# Patient Record
Sex: Male | Born: 1970 | Race: Black or African American | Hispanic: No | Marital: Married | Smoking: Never smoker
Health system: Southern US, Community
[De-identification: ages and names within clinical notes are randomized; demographics above are authoritative.]

## PROBLEM LIST (undated history)

## (undated) DIAGNOSIS — I10 Essential (primary) hypertension: Secondary | ICD-10-CM

---

## 2016-06-06 ENCOUNTER — Emergency Department: Payer: 59

## 2016-06-06 ENCOUNTER — Encounter: Payer: Self-pay | Admitting: Medical Oncology

## 2016-06-06 ENCOUNTER — Emergency Department
Admission: EM | Admit: 2016-06-06 | Discharge: 2016-06-06 | Disposition: A | Discharge status: Home / Self Care | Payer: 59 | Attending: Emergency Medicine | Admitting: Emergency Medicine

## 2016-06-06 DIAGNOSIS — M25512 Pain in left shoulder: Secondary | ICD-10-CM | POA: Insufficient documentation

## 2016-06-06 DIAGNOSIS — M25511 Pain in right shoulder: Secondary | ICD-10-CM

## 2016-06-06 DIAGNOSIS — I10 Essential (primary) hypertension: Secondary | ICD-10-CM | POA: Diagnosis not present

## 2016-06-06 HISTORY — DX: Essential (primary) hypertension: I10

## 2016-06-06 MED ORDER — METHOCARBAMOL 750 MG PO TABS: 750.0000 mg | ORAL_TABLET | Freq: Four times a day (QID) | ORAL | 0 refills | Status: DC

## 2016-06-06 MED ORDER — IBUPROFEN 800 MG PO TABS: 800.0000 mg | ORAL_TABLET | Freq: Three times a day (TID) | ORAL | 0 refills | Status: AC | PRN

## 2016-06-06 NOTE — ED Notes (Signed)
Pt reports rt shoulder pain since wed but worsened this am. 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 Pt denies chest pain. Pain only present with movement of shoulder.

## 2016-06-06 NOTE — ED Triage Notes (Signed)
Acute onset of shoulder pain after work. LROM. No raidation of pain. NAD noted

## 2016-06-06 NOTE — ED Provider Notes (Signed)
[facility] Emergency Department Provider Note  ____________________________________________  Time seen: Approximately 11:27 AM  I have reviewed the triage vital signs and the nursing notes.   HISTORY  Chief Complaint Shoulder Pain    HPI Cole Solis is a 45 y.o. male presents with complaints of rash or pain times a week. 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 States this morning is unable to lift or move his shoulder. Describes pain is 10 over 10 at the time. Denies any direct trauma to the shoulder patient does have a past medical history for hypertension and is on lisinopril.   Past Medical History:  Diagnosis Date  . Hypertension     There are no active problems to display for this patient.   History reviewed. No pertinent surgical history.  Prior to Admission medications   Medication Sig Start Date End Date Taking? Authorizing Provider  ibuprofen (ADVIL,MOTRIN) 800 MG tablet Take 1 tablet (800 mg total) by mouth every 8 (eight) hours as needed. 06/06/16   Charmayne Sheer Devontaye Ground, PA-C  methocarbamol (ROBAXIN) 750 MG tablet Take 1 tablet (750 mg total) by mouth 4 (four) times daily. 06/06/16   Evangeline Dakin, PA-C    Allergies Review of patient's allergies indicates no known allergies.  No family history on file.  Social History Social History  Substance Use Topics  . Smoking status: Never Smoker  . Smokeless tobacco: Not on file  . Alcohol use Not on file    Review of Systems Constitutional: No fever/chills Eyes: No visual changes. ENT: No sore throat. Cardiovascular: Denies chest pain. Respiratory: Denies shortness of breath. Musculoskeletal: Positive for right shoulder pain. Skin: Negative for rash. Neurological: Negative for headaches, focal weakness or numbness.  10-point ROS otherwise negative.  ____________________________________________   PHYSICAL EXAM: BP  (!) 151/105 (BP Location: Left Arm)   Pulse 85   Temp 98.2 F (36.8 C) (Oral)   Resp 18   Ht 5\' 9"  (1.753 m)   Wt 90.7 kg   SpO2 97%   BMI 29.53 kg/m   VITAL SIGNS: ED Triage Vitals  Enc Vitals Group     BP      Pulse      Resp      Temp      Temp src      SpO2      Weight      Height      Head Circumference      Peak Flow      Pain Score      Pain Loc      Pain Edu?      Excl. in GC?     Constitutional: Alert and oriented. Well appearing and in no acute distress. Cardiovascular: Normal rate, regular rhythm. Grossly normal heart sounds.  Good peripheral circulation. Respiratory: Normal respiratory effort.  No retractions. Lungs CTAB. Musculoskeletal: Right shoulder with limited range of motion increased pain with extension and abduction and adduction totally neurovascularly intact. Neurologic:  Normal speech and language. No gross focal neurologic deficits are appreciated. No gait instability. Skin:  Skin is warm, dry and intact. No rash noted. Psychiatric: Mood and affect are normal. Speech and behavior are normal.  ____________________________________________   LABS (all labs ordered are listed, but only abnormal results are displayed)  Labs Reviewed - No data to display ____________________________________________  EKG   ____________________________________________  RADIOLOGY  No acute osseous findings noted. ____________________________________________  PROCEDURES  Procedure(s) performed: None  Critical Care performed: No  ____________________________________________   INITIAL IMPRESSION / ASSESSMENT AND PLAN / ED COURSE  Pertinent labs & imaging results that were available during my care of the patient were reviewed by me and considered in my medical decision making (see chart for details).  Hypertension poorly controlled. Would make a recommendation that he should probably switch groups of blood pressure medications and consider being on  calcium channel blocker. Right shoulder strain with no fracture. Rx given for baclofen 10 mg 4 times a day ibuprofen 800 mg 3 times a day. He is to follow-up with his PCP or return to the ER with any worsening symptomology.  Clinical Course    ____________________________________________   FINAL CLINICAL IMPRESSION(S) / ED DIAGNOSES  Final diagnoses:  Shoulder pain, right     This chart was dictated using voice recognition software/Dragon. Despite best efforts to proofread, errors can occur which can change the meaning. Any change was purely unintentional.    Evangeline Dakin, PA-C 06/06/16 1224    Jene Every, MD 06/06/16 [phone]

## 2016-06-06 NOTE — ED Triage Notes (Signed)
PT reports he began having rt shoulder pain with movement wed. 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

## 2016-12-25 ENCOUNTER — Encounter: Payer: Self-pay | Admitting: Emergency Medicine

## 2016-12-25 ENCOUNTER — Emergency Department
Admission: EM | Admit: 2016-12-25 | Discharge: 2016-12-25 | Disposition: A | Discharge status: Home / Self Care | Payer: 59 | Attending: Student in an Organized Health Care Education/Training Program | Admitting: Student in an Organized Health Care Education/Training Program

## 2016-12-25 DIAGNOSIS — S3992XA Unspecified injury of lower back, initial encounter: Secondary | ICD-10-CM | POA: Diagnosis present

## 2016-12-25 DIAGNOSIS — I10 Essential (primary) hypertension: Secondary | ICD-10-CM | POA: Diagnosis not present

## 2016-12-25 DIAGNOSIS — S39012A Strain of muscle, fascia and tendon of lower back, initial encounter: Secondary | ICD-10-CM | POA: Diagnosis not present

## 2016-12-25 DIAGNOSIS — Y9389 Activity, other specified: Secondary | ICD-10-CM | POA: Insufficient documentation

## 2016-12-25 DIAGNOSIS — M62830 Muscle spasm of back: Secondary | ICD-10-CM

## 2016-12-25 DIAGNOSIS — Y990 Civilian activity done for income or pay: Secondary | ICD-10-CM | POA: Insufficient documentation

## 2016-12-25 DIAGNOSIS — X501XXA Overexertion from prolonged static or awkward postures, initial encounter: Secondary | ICD-10-CM | POA: Diagnosis not present

## 2016-12-25 DIAGNOSIS — Y929 Unspecified place or not applicable: Secondary | ICD-10-CM | POA: Diagnosis not present

## 2016-12-25 MED ORDER — PREDNISONE 10 MG PO TABS: 10.0000 mg | ORAL_TABLET | Freq: Every day | ORAL | 0 refills | Status: AC

## 2016-12-25 MED ORDER — LISINOPRIL 5 MG PO TABS: 5.0000 mg | ORAL_TABLET | Freq: Every day | ORAL | 1 refills | Status: AC

## 2016-12-25 MED ORDER — BACLOFEN 10 MG PO TABS: 10.0000 mg | ORAL_TABLET | Freq: Three times a day (TID) | ORAL | 0 refills | Status: AC

## 2016-12-25 NOTE — Discharge Instructions (Signed)
Please take blood pressure medication as prescribed and follow-up with your primary care physician as soon as possible. Return to the ER for any headaches chest pain shortness of breath worsening symptoms or urgent changes in her health. Please work on doing gentle stretching exercises of the lower back. Take medications as prescribed for muscle spasms. Avoid any heavy lifting pushing or pulling. Follow-up with orthopedist in 7 days if no improvement.

## 2016-12-25 NOTE — ED Notes (Signed)

## 2016-12-25 NOTE — ED Triage Notes (Signed)
Pain in back after lifting at work Tuesday 4 days ago.

## 2016-12-25 NOTE — ED Provider Notes (Signed)
[facility]-EMERGENCY DEPARTMENT Provider Note   CSN: [id number] Arrival date & time: 12/25/16  1446     History   Chief Complaint Chief Complaint  Patient presents with  . Back Pain    HPI Cole Solis is a 46 y.o. male  presents to the emergency department for evaluation of lower back pain. Patient developed lower back pain 4 days ago at work. 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 Patient went home that night developed significant tightness in the left and right side of his lumbar spine. He was able to return to work the next day because the day went on his lower back continued to tighten up. Patient tried taking a muscle relaxer he had at home, Robaxin 750 mg 4 times a day with no improvement. He took occasional leaf with minimal improvement. Patient has been resting today and has noticed continued tightness of the lower back of the lumbar spine. There is no numbness tingling or radicular symptoms in the lower extremities. He denies any chest pain, shortness of breath, abdominal pain, weakness of the lower extremities. No loss of bowel or bladder symptoms. No urinary symptoms. Patient is noted to have high blood pressure in triage, states he has a history of hypertension that was managed in the past with lisinopril 5 mg daily. He has been without this medication for quite some time and has not followed up with primary care provider to get the refill. He currently denies any headaches, vision changes, chest pain, chest pain with exertion.  HPI  Past Medical History:  Diagnosis Date  . Hypertension     There are no active problems to display for this patient.   History reviewed. No pertinent surgical history.     Home Medications    Prior to Admission medications   Medication Sig Start Date End Date Taking? Authorizing Provider  baclofen (LIORESAL) 10 MG tablet Take 1 tablet  (10 mg total) by mouth 3 (three) times daily. As needed for muscle spasm 12/25/16   Evon Slack, PA-C  ibuprofen (ADVIL,MOTRIN) 800 MG tablet Take 1 tablet (800 mg total) by mouth every 8 (eight) hours as needed. 06/06/16   Charmayne Sheer Beers, PA-C  lisinopril (PRINIVIL,ZESTRIL) 5 MG tablet Take 1 tablet (5 mg total) by mouth daily. 12/25/16 12/25/17  Evon Slack, PA-C  predniSONE (DELTASONE) 10 MG tablet Take 1 tablet (10 mg total) by mouth daily. 6,5,4,3,2,1 six day taper 12/25/16   Evon Slack, PA-C    Family History No family history on file.  Social History Social History  Substance Use Topics  . Smoking status: Never Smoker  . Smokeless tobacco: Not on file  . Alcohol use Not on file     Allergies   Patient has no known allergies.   Review of Systems Review of Systems  Constitutional: Negative.  Negative for activity change, appetite change, chills and fever.  HENT: Negative for congestion, ear pain, mouth sores, rhinorrhea, sinus pressure, sore throat and trouble swallowing.   Eyes: Negative for photophobia, pain and discharge.  Respiratory: Negative for cough, chest tightness and shortness of breath.   Cardiovascular: Negative for chest pain and leg swelling.  Gastrointestinal: Negative for abdominal distention, abdominal pain, diarrhea, nausea and vomiting.  Genitourinary: Negative for difficulty urinating and dysuria.  Musculoskeletal: Positive for back pain. Negative for arthralgias and gait problem.  Skin:  Negative for color change and rash.  Neurological: Negative for dizziness and headaches.  Hematological: Negative for adenopathy.  Psychiatric/Behavioral: Negative for agitation and behavioral problems.     Physical Exam Updated Vital Signs BP (!) 164/105   Pulse 79   Temp 98.2 F (36.8 C) (Oral)   Resp 20   Ht 5\' 9"  (1.753 m)   Wt 90.7 kg   SpO2 97%   BMI 29.53 kg/m   Physical Exam  Constitutional: He appears well-developed and well-nourished.    HENT:  Head: Normocephalic and atraumatic.  Eyes: Conjunctivae are normal.  Neck: Normal range of motion. Neck supple.  Cardiovascular: Normal rate, regular rhythm, normal heart sounds and intact distal pulses.  Exam reveals no gallop and no friction rub.   No murmur heard. Pulmonary/Chest: Effort normal and breath sounds normal. No respiratory distress. He exhibits no tenderness.  Abdominal: Soft. He exhibits no mass. There is no tenderness. There is no rebound and no guarding.  Musculoskeletal: He exhibits no edema.  Lumbar Spine: Examination of the lumbar spine reveals no bony abnormality, no edema, and no ecchymosis.  There is no step off.  The patient has decreased range of motion of the lumbar spine with flexion and extension. The patient has normal lateral bend and rotation.  The patient has pain mostly with lumbar flexion.  The patient has a negative axial load test, and a negative rotational Waddell test.  The patient is non tender along the spinous process.  The patient is tender along the left and right paravertebral muscles of the lumbosacral junction.  The patient is non tender along the iliac crest.  The patient is non tender in the sciatic notch.  The patient is non tender along the Sacroiliac joint.  There is no Coccyx joint tenderness.    Bilateral Lower Extremities: Examination of the lower extremities reveals no bony abnormality, no edema, and no ecchymosis.  The patient has full active and passive range of motion of the hips, knees, and ankles.  There is no discomfort with range of motion exercises.  The patient is non tender along the greater trochanter region.  The patient has a negative Denna HaggardHomans' test bilaterally.  There is normal skin warmth.  There is normal capillary refill bilaterally.    Neurologic: The patient has a negative straight leg raise.  The patient has normal muscle strength testing for the quadriceps, calves, ankle dorsiflexion, ankle plantarflexion, and  extensor hallicus longus.  The patient has sensation that is intact to light touch.   Neurological: He is alert.  Skin: Skin is warm and dry.  Psychiatric: He has a normal mood and affect. His behavior is normal. Judgment and thought content normal.  Nursing note and vitals reviewed.    ED Treatments / Results  Labs (all labs ordered are listed, but only abnormal results are displayed) Labs Reviewed - No data to display  EKG  EKG Interpretation None       Radiology No results found.  Procedures Procedures (including critical care time)  Medications Ordered in ED Medications - No data to display   Initial Impression / Assessment and Plan / ED Course  I have reviewed the triage vital signs and the nursing notes.  Pertinent labs & imaging results that were available during my care of the patient were reviewed by me and considered in my medical decision making (see chart for details).     46 year old male with mechanical low back pain after lifting an item at work 4  days ago. Physical examination consistent with lower lumbar muscle strain. He has no sciatica or neurological deficits. Recommend prednisone with baclofen. He is noted to have hypertension today in the emergency department, he has been without his blood pressure medicine. We will refill this today. No chest pain or shortness of breath, headache, nausea, diaphoresis. No chest pain with exertion. We'll have patient follow-up with PCP as soon as possible. In regards to lower back pain follow-up with orthopedics if no improvement in 5-7 days. Educated on signs and symptoms for return to the ED for.  Final Clinical Impressions(s) / ED Diagnoses   Final diagnoses:  Muscle spasm of back  Hypertension, unspecified type  Strain of lumbar region, initial encounter    New Prescriptions New Prescriptions   BACLOFEN (LIORESAL) 10 MG TABLET    Take 1 tablet (10 mg total) by mouth 3 (three) times daily. As needed for muscle  spasm   LISINOPRIL (PRINIVIL,ZESTRIL) 5 MG TABLET    Take 1 tablet (5 mg total) by mouth daily.   PREDNISONE (DELTASONE) 10 MG TABLET    Take 1 tablet (10 mg total) by mouth daily. 6,5,4,3,2,1 six day taper     Evon Slack, PA-C 12/25/16 1606    Willy Eddy, MD 12/25/16 [phone]

## 2016-12-25 NOTE — ED Notes (Signed)
See triage note. C/o lower back pain since Monday following lifting injury at work. Pt states he has been using icyhot patches and robaxin 750mg  4x/day with some relief but pain has worsened over the week.

## 2018-04-28 ENCOUNTER — Other Ambulatory Visit: Payer: Self-pay | Admitting: Nephrology

## 2018-04-28 DIAGNOSIS — N183 Chronic kidney disease, stage 3 unspecified: Secondary | ICD-10-CM

## 2018-04-28 DIAGNOSIS — N281 Cyst of kidney, acquired: Secondary | ICD-10-CM

## 2018-04-30 ENCOUNTER — Encounter: Payer: Self-pay | Admitting: Emergency Medicine

## 2018-04-30 ENCOUNTER — Emergency Department: Payer: 59

## 2018-04-30 ENCOUNTER — Emergency Department
Admission: EM | Admit: 2018-04-30 | Discharge: 2018-04-30 | Disposition: A | Discharge status: Home / Self Care | Payer: 59 | Attending: Emergency Medicine | Admitting: Emergency Medicine

## 2018-04-30 DIAGNOSIS — Z79899 Other long term (current) drug therapy: Secondary | ICD-10-CM | POA: Diagnosis not present

## 2018-04-30 DIAGNOSIS — K439 Ventral hernia without obstruction or gangrene: Secondary | ICD-10-CM | POA: Insufficient documentation

## 2018-04-30 DIAGNOSIS — I10 Essential (primary) hypertension: Secondary | ICD-10-CM | POA: Insufficient documentation

## 2018-04-30 DIAGNOSIS — R109 Unspecified abdominal pain: Secondary | ICD-10-CM | POA: Diagnosis present

## 2018-04-30 MED ORDER — HYDROCODONE-ACETAMINOPHEN 5-325 MG PO TABS: 1.0000 | ORAL_TABLET | ORAL | 0 refills | Status: AC | PRN

## 2018-04-30 NOTE — ED Notes (Signed)
AAOx3.  Skin warm and dry.  NAD 

## 2018-04-30 NOTE — ED Notes (Signed)
Pt back from ct

## 2018-04-30 NOTE — ED Provider Notes (Signed)
[facility] Emergency Department Provider Note  Time seen: 2:26 PM  I have reviewed the triage vital signs and the nursing notes.   HISTORY  Chief Complaint Hernia    HPI Luismario Coston is a 47 y.o. male with a past medical history of hypertension presents to the emergency department for abdominal pain.  According to the patient he has had a hernia in the center of his abdomen for several years.  States he has spoken to his surgeon about getting it repaired but he did not have the finances at the time.  He states today he was moving some things around the house when he felt increased pain in this area.  States the hernia is bulging out a little, but states it always does that.  States mild to moderate discomfort over the area of the hernia.  Denies any nausea or vomiting.  Normal bowel movement today.  Denies any fever.   Past Medical History:  Diagnosis Date  . Hypertension     There are no active problems to display for this patient.   History reviewed. No pertinent surgical history.  Prior to Admission medications   Medication Sig Start Date End Date Taking? Authorizing Provider  baclofen (LIORESAL) 10 MG tablet Take 1 tablet (10 mg total) by mouth 3 (three) times daily. As needed for muscle spasm 12/25/16   Evon Slack, PA-C  ibuprofen (ADVIL,MOTRIN) 800 MG tablet Take 1 tablet (800 mg total) by mouth every 8 (eight) hours as needed. 06/06/16   Beers, Charmayne Sheer, PA-C  lisinopril (PRINIVIL,ZESTRIL) 5 MG tablet Take 1 tablet (5 mg total) by mouth daily. 12/25/16 12/25/17  Evon Slack, PA-C  predniSONE (DELTASONE) 10 MG tablet Take 1 tablet (10 mg total) by mouth daily. 6,5,4,3,2,1 six day taper 12/25/16   Evon Slack, PA-C    No Known Allergies  No family history on file.  Social History Social History   Tobacco Use  . Smoking status: Never Smoker  . Smokeless tobacco: Never Used  Substance Use Topics  . Alcohol use: Not on file  .  Drug use: Not on file    Review of Systems Constitutional: Negative for fever. Cardiovascular: Negative for chest pain. Respiratory: Negative for shortness of breath. Gastrointestinal: Mild to moderate mid abdominal pain overlying the hernia site.  Negative for nausea vomiting.  Negative for constipation. Genitourinary: Negative for urinary compaints Neurological: Negative for headache All other ROS negative  ____________________________________________   PHYSICAL EXAM:  VITAL SIGNS: ED Triage Vitals  Enc Vitals Group     BP 04/30/18 1317 (!) 140/100     Pulse Rate 04/30/18 1317 84     Resp 04/30/18 1317 18     Temp 04/30/18 1317 98.6 F (37 C)     Temp Source 04/30/18 1317 Oral     SpO2 04/30/18 1317 98 %     Weight 04/30/18 1318 213 lb (96.6 kg)     Height 04/30/18 1318 5\' 9"  (1.753 m)     Head Circumference --      Peak Flow --      Pain Score 04/30/18 1317 7     Pain Loc --      Pain Edu? --      Excl. in GC? --     Constitutional: Alert and oriented. Well appearing and in no distress. Eyes: Normal exam ENT   Head: Normocephalic and atraumatic.   Mouth/Throat: Mucous membranes are moist. Cardiovascular: Normal rate, regular rhythm. No  murmur Respiratory: Normal respiratory effort without tachypnea nor retractions. Breath sounds are clear  Gastrointestinal: Abdomen is soft.  Patient has a small appearing ventral hernia versus periumbilical hernia in the midline superior to the umbilicus.  Mild tenderness to palpation.  Appears to partially reduce with palpation although does not completely reduce. Musculoskeletal: Nontender with normal range of motion in all extremities.  Neurologic:  Normal speech and language. No gross focal neurologic deficits Skin:  Skin is warm, dry and intact.  Psychiatric: Mood and affect are normal.   ____________________________________________    RADIOLOGY  CT shows ventral hernia containing omental fat  only.  ____________________________________________   INITIAL IMPRESSION / ASSESSMENT AND PLAN / ED COURSE  Pertinent labs & imaging results that were available during my care of the patient were reviewed by me and considered in my medical decision making (see chart for details).  Patient presents to the emergency department for mid abdominal pain, has a history of hernia.  Differential would include incarcerated hernia, strangulate hernia, fat-containing versus bowel containing hernia.  Given the patient's increased discomfort to this area we will obtain CT imaging to rule out strangulated or incarcerated bowel.  There are no skin color changes no significant swelling.  CT shows ventral hernia with omental fat only, possible small infarction of the omental fat.  We will discharge her pain medication and surgery follow-up.  Patient agreeable to plan of care.  ____________________________________________   FINAL CLINICAL IMPRESSION(S) / ED DIAGNOSES  Abdominal pain Hernia    Minna AntisPaduchowski, Kwana Ringel, MD 04/30/18 1511

## 2018-04-30 NOTE — ED Notes (Signed)
Pt states that he has had a hernia in the mid front of his abd for awhile, states that today he moved some heavy furniture and felt a pain in the area of his hernia, pt states that it is more tender than typical and states that it seems to bulge out more than normal with moving. bs x4, no distress noted at this time, rates pain 6/10

## 2018-04-30 NOTE — ED Triage Notes (Signed)
Patient reports some abdominal discomfort at area of hernia that began after patient was moving something.  Patient states he has a known hernia and he felt like something, "moved" and now has slight discomfort.  Patient states, "I just wanted to go ahead and get it checked out."  Patient states area is not "sticking out" more than normal.

## 2018-04-30 NOTE — Discharge Instructions (Addendum)
Please take your pain medication if needed, but only as written.  Please follow-up with general surgery for further evaluation and to discuss possible surgical repair.  Return to the emergency department for any worsening pain, fever, or any other symptom personally concerning to yourself.

## 2018-05-01 ENCOUNTER — Telehealth: Payer: Self-pay | Admitting: General Practice

## 2018-05-01 NOTE — Telephone Encounter (Signed)
Left a message for the patient to call the office from when he was seen in the ED. Please schedule if patient calls.

## 2018-05-01 NOTE — Telephone Encounter (Signed)
-----   Message from Nicole KindredAngela K Brouillard sent at 05/01/2018 11:07 AM EDT ----- Regarding: ED referral-New patient Patient seen in ED for ventral hernia. New patient. Not urgent. Can see Dr Everlene FarrierPabon or Earlene Plateravis when there is an opening.

## 2018-05-02 NOTE — Telephone Encounter (Signed)
Left another message for the patient to call the office. °

## 2018-05-05 ENCOUNTER — Ambulatory Visit: Payer: 59

## 2018-05-17 NOTE — Telephone Encounter (Signed)
Left several messages for the patient to call the office, I have mailed a letter for the patient to contact our office.

## 2019-12-28 IMAGING — CT CT RENAL STONE PROTOCOL
2 of 4 series · 16 of 46 positions shown, 18 images · non-contrast
Comparison: None.

CLINICAL DATA: Abdominal pain today after moving heavy furniture.
Known ventral hernia.

EXAM:
CT ABDOMEN AND PELVIS WITHOUT CONTRAST
TECHNIQUE: Multidetector CT imaging of the abdomen and pelvis was performed
following the standard protocol without IV contrast.

[Series 2: stone full standard · axial · 0.71mm/px · z∈[-542,-112]mm · 13 of 94 slices shown, 15 images]
[im 4/94  soft-tissue]
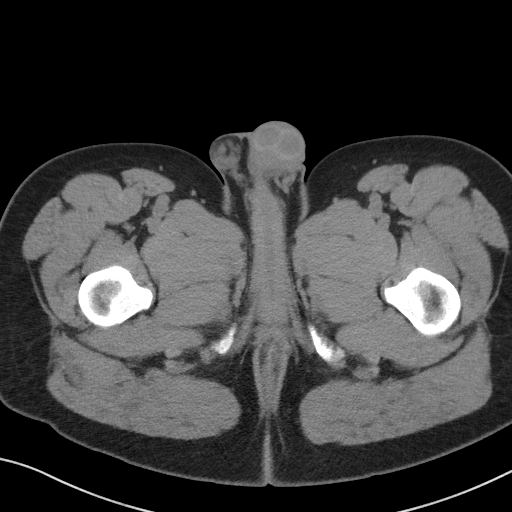
[im 4/94  bone]
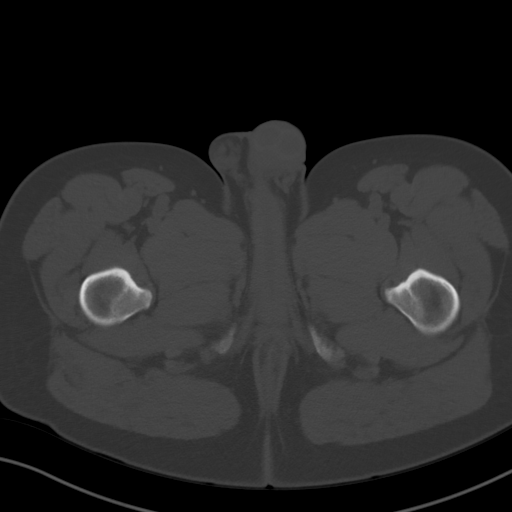
[im 12/94  soft-tissue]
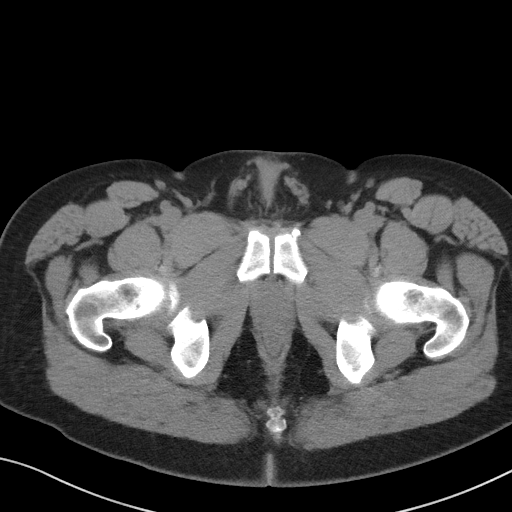
[im 19/94  soft-tissue]
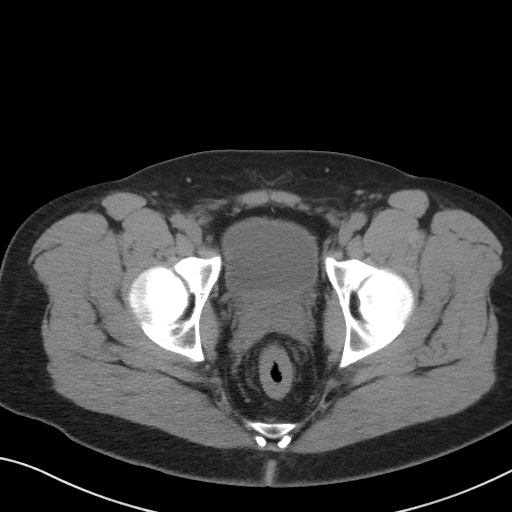
[im 27/94  soft-tissue]
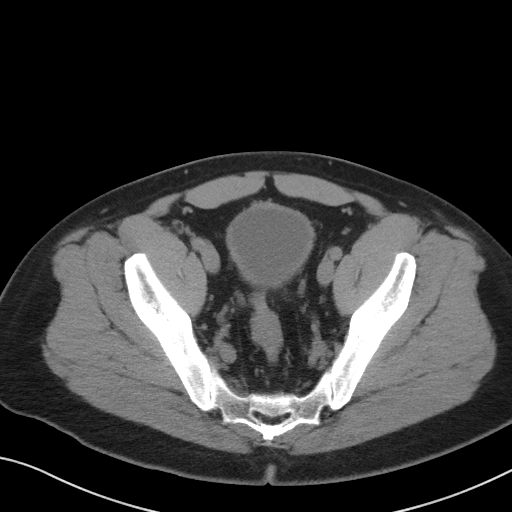
[im 34/94  soft-tissue]
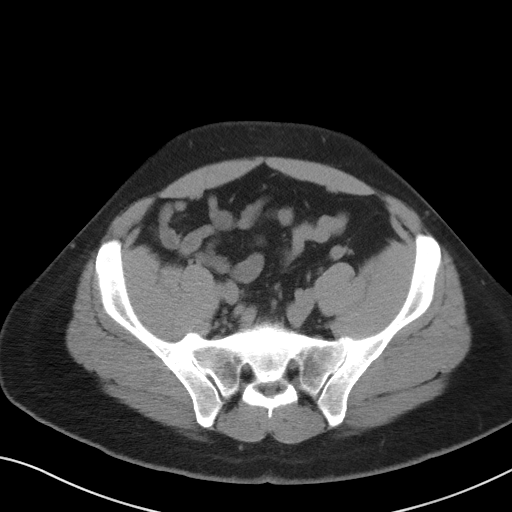
[im 41/94  soft-tissue]
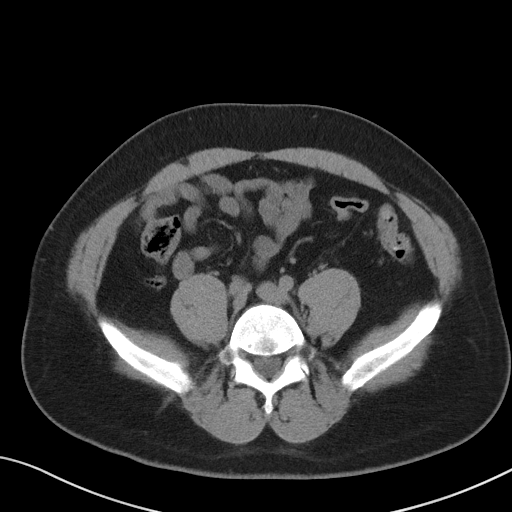
[im 49/94  soft-tissue]
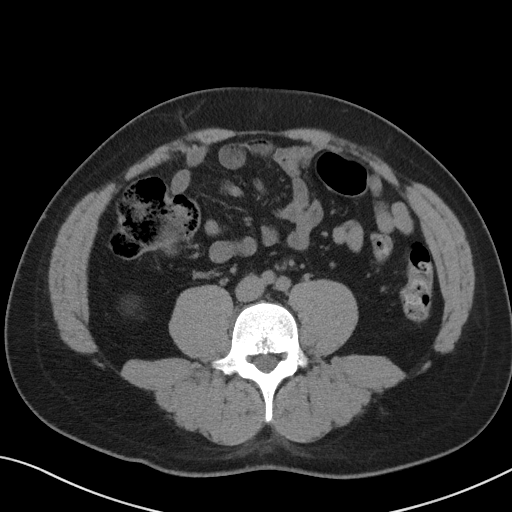
[im 53/94  soft-tissue]
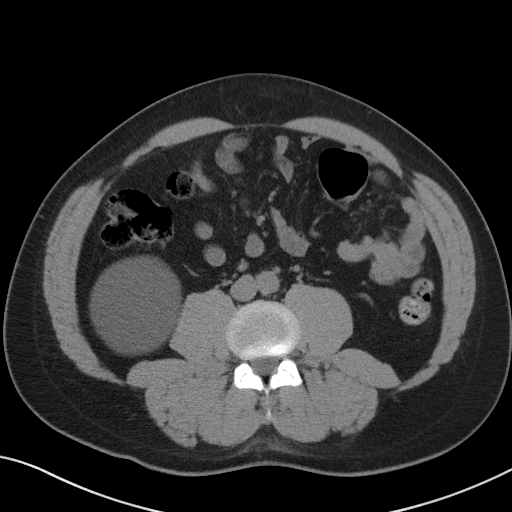
[im 60/94  soft-tissue]
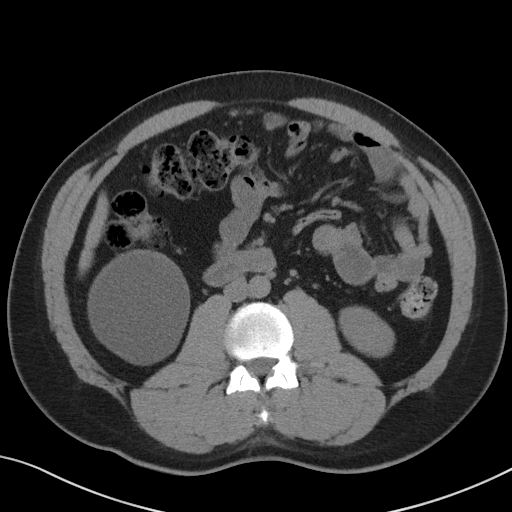
[im 60/94  bone]
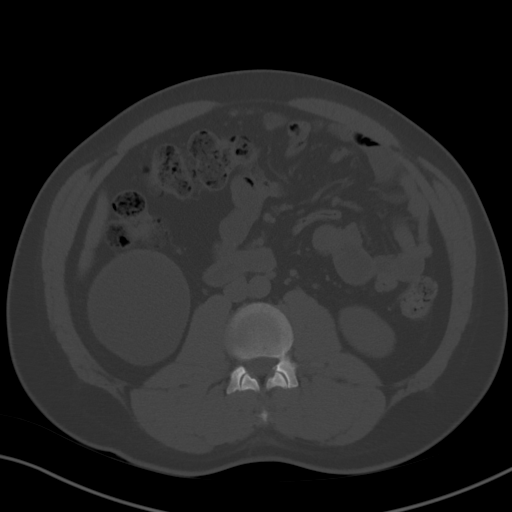
[im 67/94  soft-tissue]
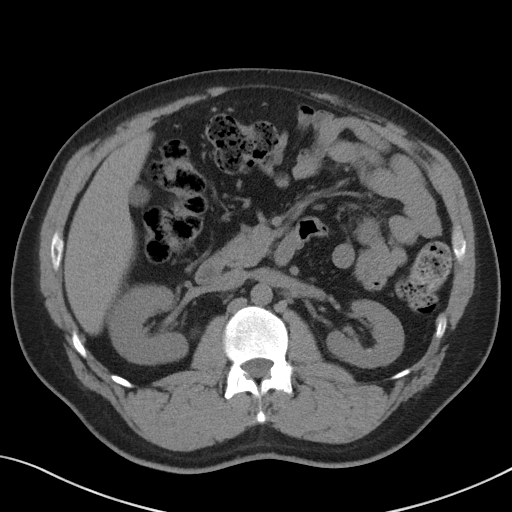
[im 75/94  soft-tissue]
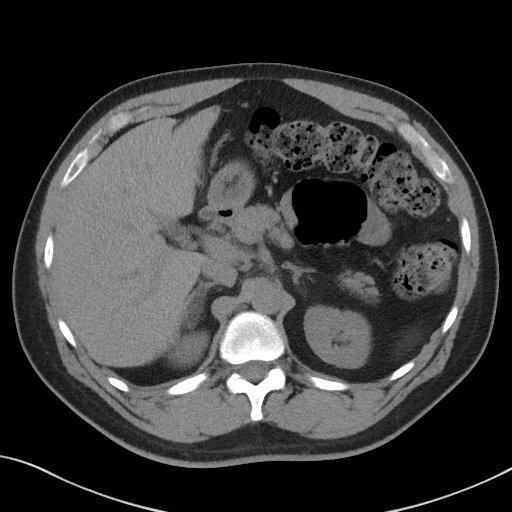
[im 82/94  soft-tissue]
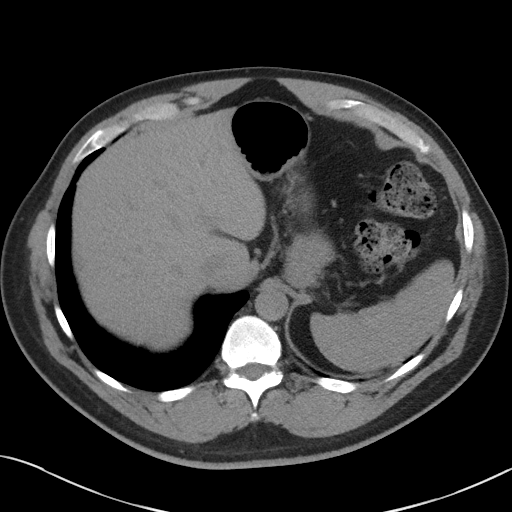
[im 90/94  soft-tissue]
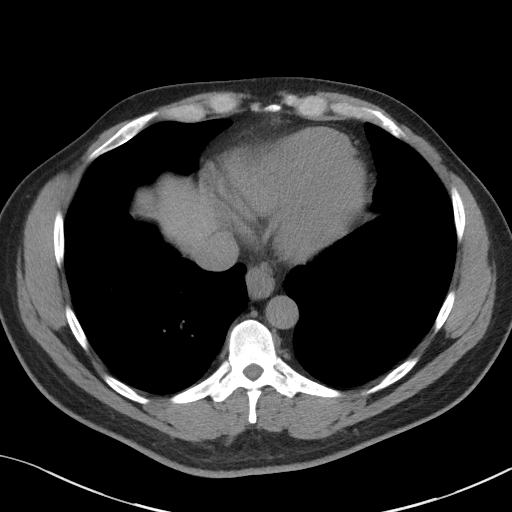

[Series 5: coronal · coronal · 0.72mm/px · 3 of 151 slices shown]
[im 51/151  soft-tissue]
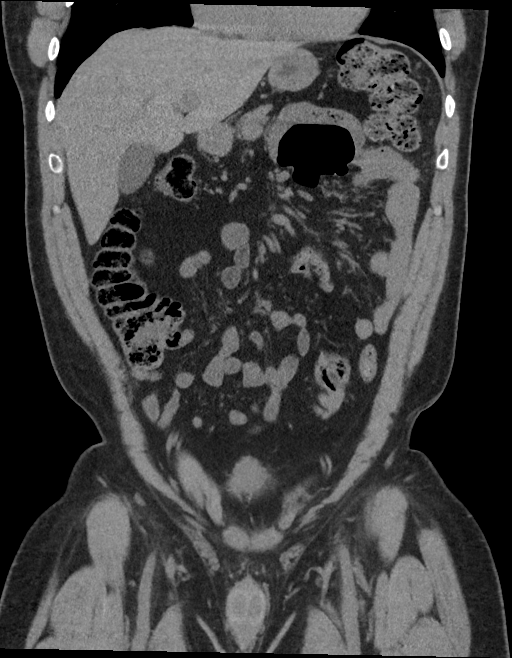
[im 67/151  soft-tissue]
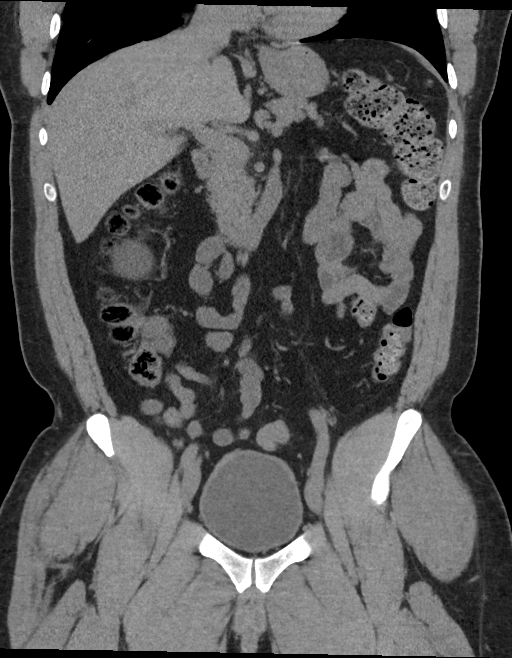
[im 84/151  soft-tissue]
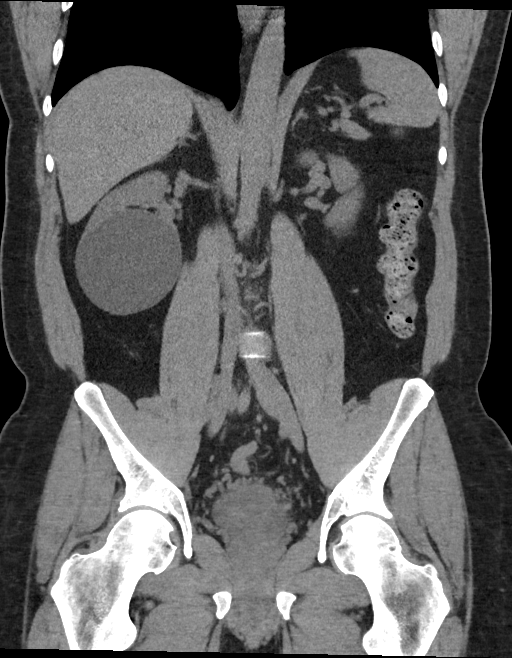

[16 of 46 positions shown; findings below may reference images not displayed]

FINDINGS: Lower chest: Lung bases are normal.

Hepatobiliary: 1.2 cm cyst adjacent the gallbladder fossa.
Gallbladder and biliary tree otherwise unremarkable.

Pancreas: Normal.

Spleen: Normal.

Adrenals/Urinary Tract: Adrenal glands are normal. Kidneys normal in
size without hydronephrosis. There are several bilateral
calcifications over the corticomedullary junctions suggesting
medullary sponge kidney. Two right renal cysts with the larger over
the lower pole measuring 8 cm. Ureters and bladder are normal.

Stomach/Bowel: Stomach and small bowel are normal. Appendix is
normal. Mild circumferential wall thickening of the rectum without
adjacent inflammatory change or fluid. Mild redundancy of the left
colon.

Vascular/Lymphatic: Normal.

Reproductive: Within normal.

Other: No free fluid or focal inflammatory change. Small umbilical
hernia containing only peritoneal fat. Ventral hernia above the
umbilicus just right of midline containing only peritoneal fat.
Subtle hazy density over the fascial defect which may be due to
minimal omental infarction.

Musculoskeletal: Normal.
IMPRESSION: Small ventral hernia above the umbilicus just right of midline
containing only peritoneal fat. Subtle hazy density over the fascial
defect which may be due to mild associated omental infarction.

Small umbilical hernia containing only peritoneal fat.

Two right renal cysts with the larger over the lower pole measuring
8 cm. Bilateral calcification along the corticomedullary junctions
suggesting medullary sponge kidney.

1.2 cm liver cyst.
